# Patient Record
Sex: Male | Born: 2000 | Race: White | Hispanic: No | Marital: Single | Smoking: Never smoker
Health system: Southern US, Community
[De-identification: ages and names within clinical notes are randomized; demographics above are authoritative.]

---
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
## 2018-02-06 IMAGING — CT CT RENAL STONE PROTOCOL
2 of 4 series · 16 of 46 positions shown, 18 images · non-contrast
Comparison: None

CLINICAL DATA: RIGHT CVA/flank pain since early this morning,
microscopic hematuria

EXAM:
CT ABDOMEN AND PELVIS WITHOUT CONTRAST
TECHNIQUE: Multidetector CT imaging of the abdomen and pelvis was performed
following the standard protocol without IV contrast. Sagittal and
coronal MPR images reconstructed from axial data set. Oral contrast
was not administered

[Series 2: renal stone 5mm · axial · 0.63mm/px · z∈[+640,+1075]mm · 13 of 95 slices shown, 15 images]
[im 4/95  soft-tissue]
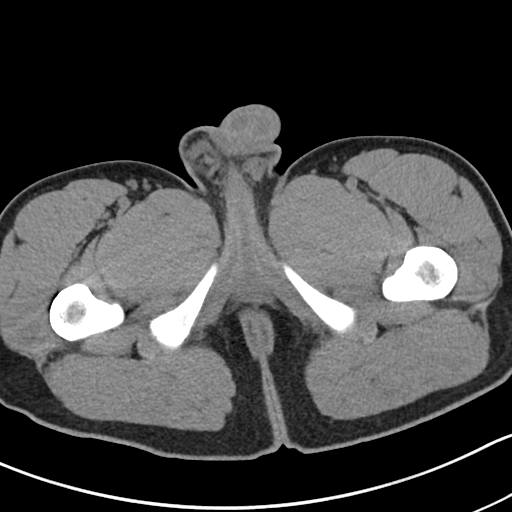
[im 4/95  bone]
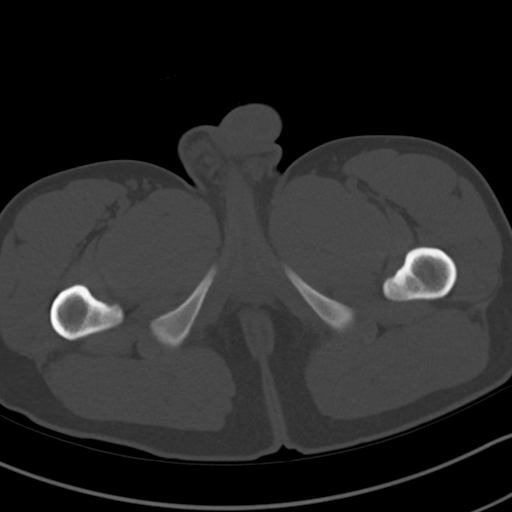
[im 12/95  soft-tissue]
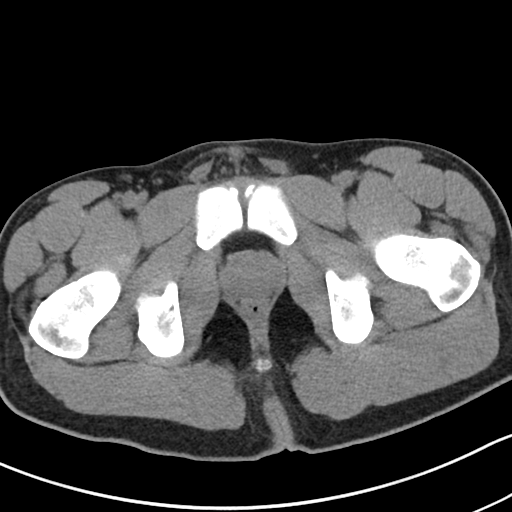
[im 19/95  soft-tissue]
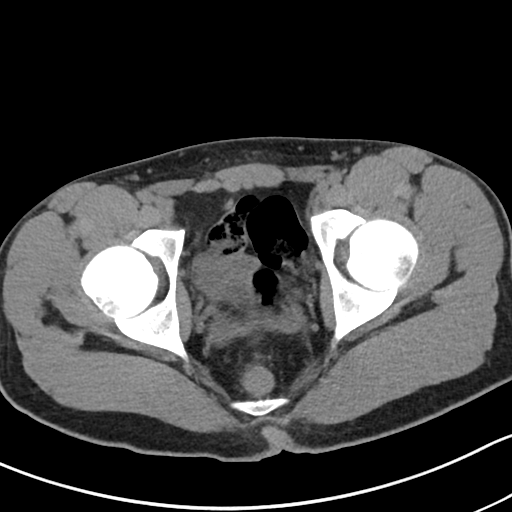
[im 27/95  soft-tissue]
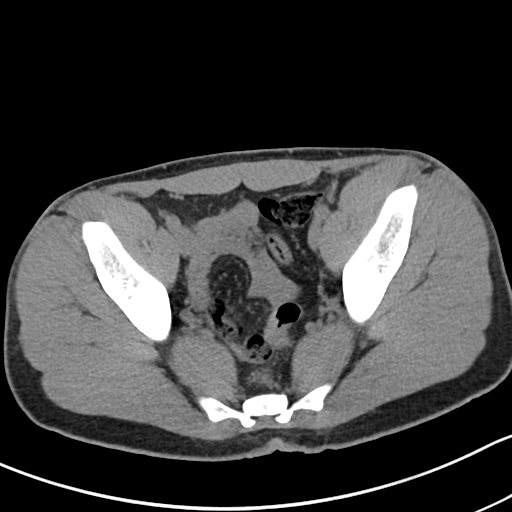
[im 34/95  soft-tissue]
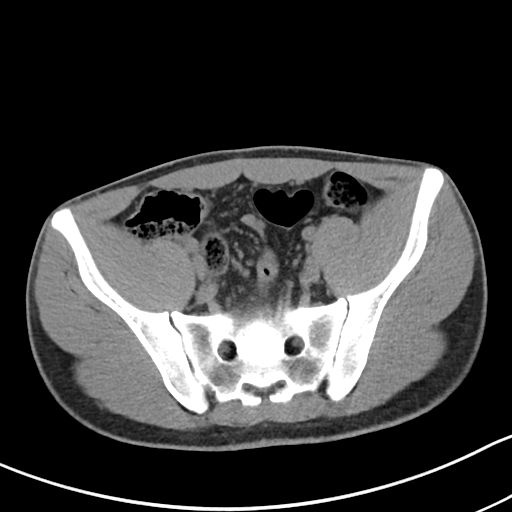
[im 42/95  soft-tissue]
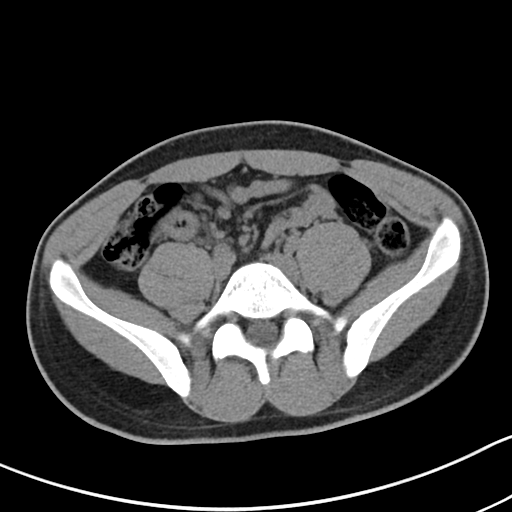
[im 49/95  soft-tissue]
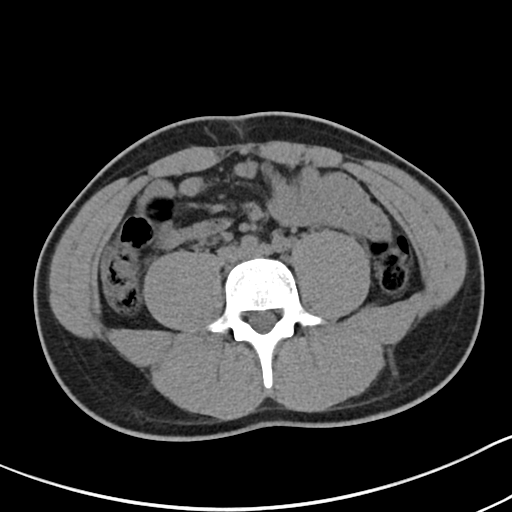
[im 53/95  soft-tissue]
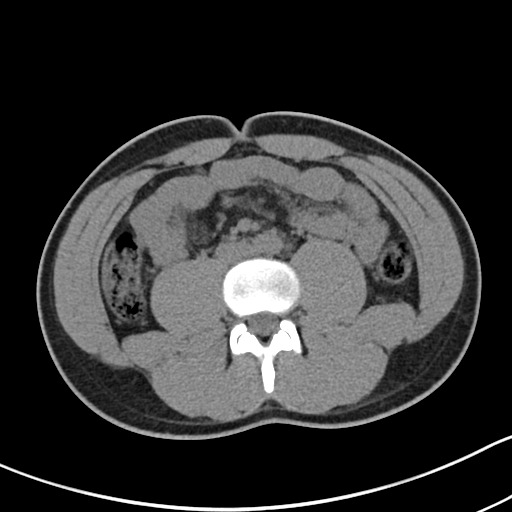
[im 61/95  soft-tissue]
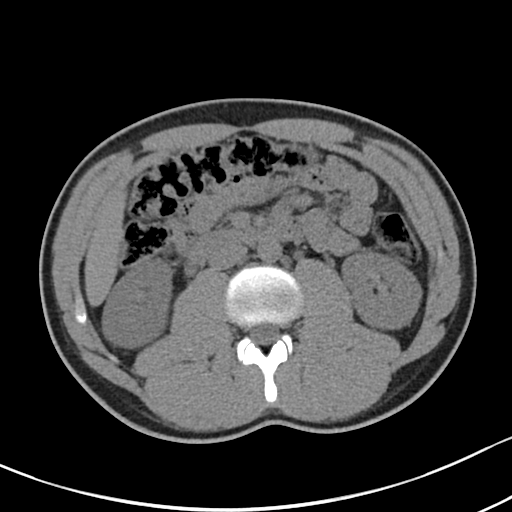
[im 61/95  bone]
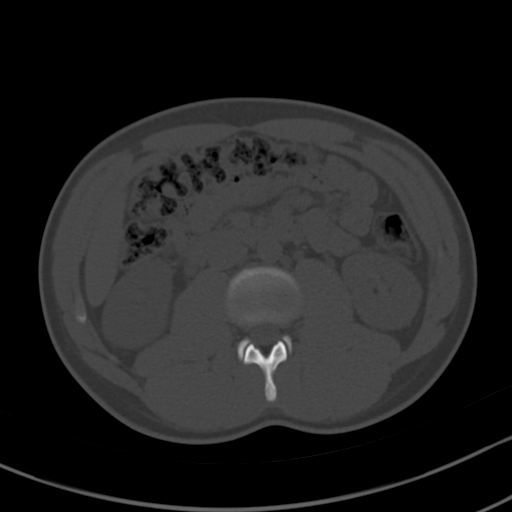
[im 68/95  soft-tissue]
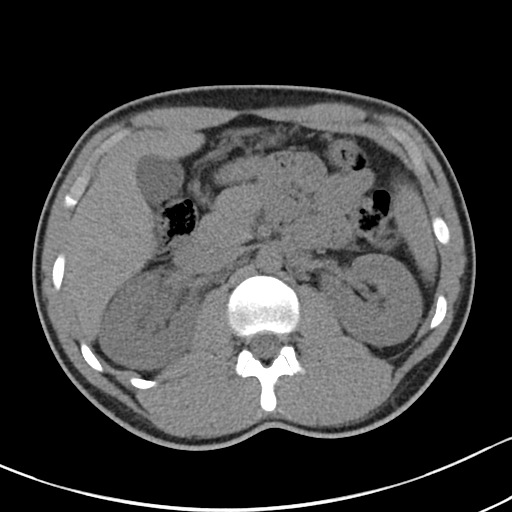
[im 76/95  soft-tissue]
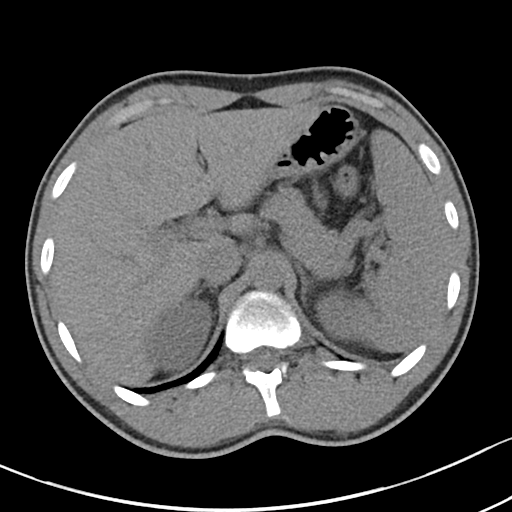
[im 83/95  soft-tissue]
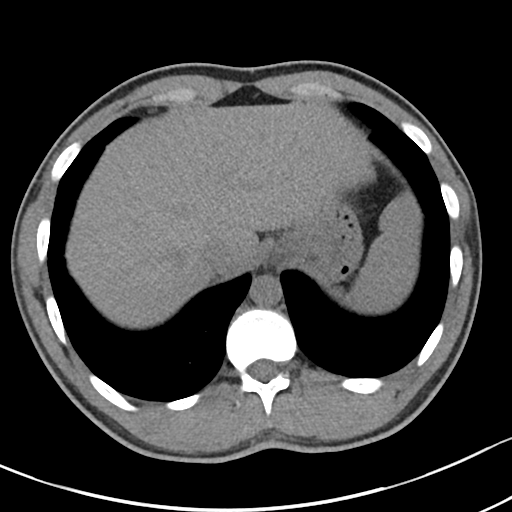
[im 91/95  soft-tissue]
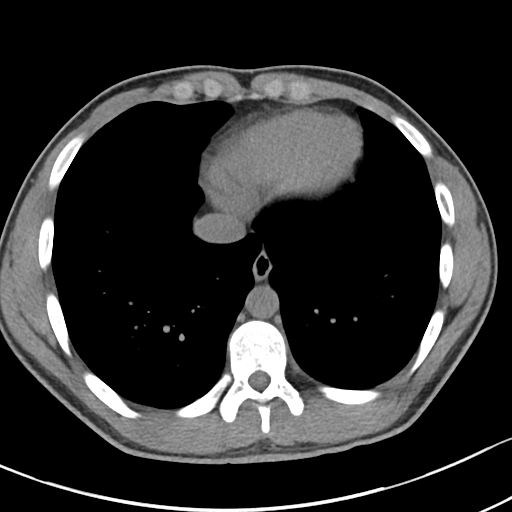

[Series 4: renal stone 3.0 cor · coronal · 0.64mm/px · 3 of 101 slices shown]
[im 34/101  soft-tissue]
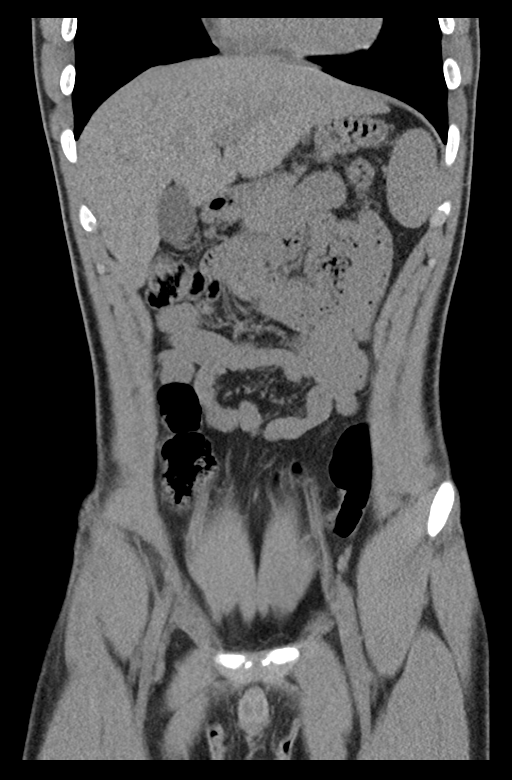
[im 45/101  soft-tissue]
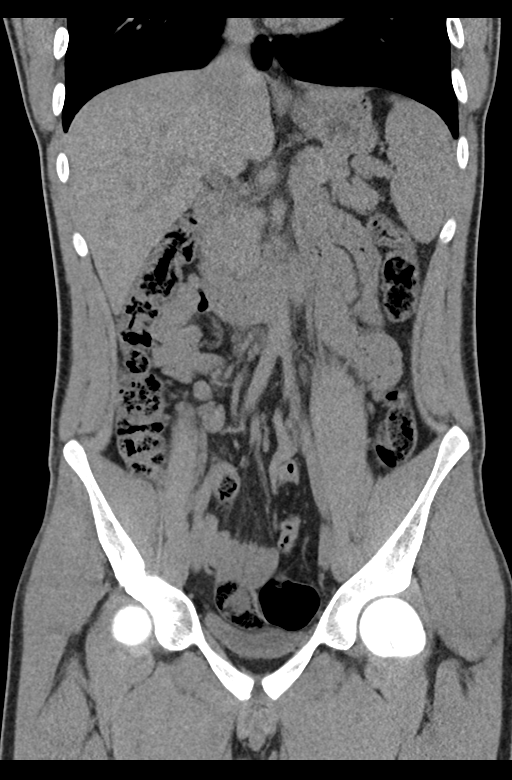
[im 56/101  soft-tissue]
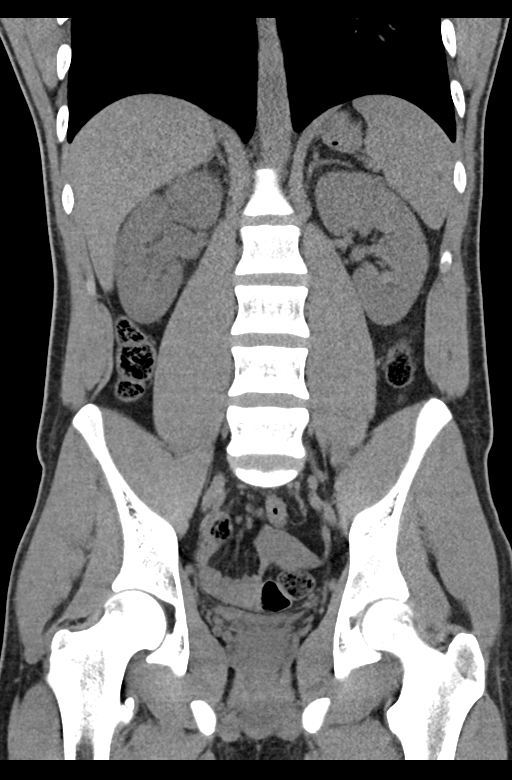

[16 of 46 positions shown; findings below may reference images not displayed]

FINDINGS: Lower chest: Cleared

Hepatobiliary: Normal appearance

Pancreas: Normal appearance

Spleen: Normal appearance

Adrenals/Urinary Tract: RIGHT hydronephrosis due to a 5 mm proximal
RIGHT ureteral calculus. Additional tiny calcification 2 mm diameter
is seen inferior to the 5 mm calculus, question additional tiny
proximal RIGHT ureteral calculus. Kidneys, LEFT ureter, and bladder
otherwise normal appearance.

Stomach/Bowel: Normal appendix. Stomach and bowel loops unremarkable
for technique.

Vascular/Lymphatic: Normal appearance

Reproductive: N/A

Other: No free air or free fluid.  No hernia.

Musculoskeletal: No acute osseous findings. Small RIGHT os
acetabulum.
IMPRESSION: RIGHT hydronephrosis secondary to a 5 mm proximal RIGHT ureteral
calculus with question additional tiny 2 mm proximal LEFT ureteral
calculus as well.

## 2020-03-01 DIAGNOSIS — Z23 Encounter for immunization: Secondary | ICD-10-CM | POA: Diagnosis not present

## 2020-04-04 DIAGNOSIS — Z23 Encounter for immunization: Secondary | ICD-10-CM | POA: Diagnosis not present

## 2020-06-08 DIAGNOSIS — N132 Hydronephrosis with renal and ureteral calculous obstruction: Secondary | ICD-10-CM | POA: Diagnosis not present

## 2020-06-08 DIAGNOSIS — N200 Calculus of kidney: Secondary | ICD-10-CM | POA: Diagnosis not present

## 2020-06-08 DIAGNOSIS — R109 Unspecified abdominal pain: Secondary | ICD-10-CM | POA: Diagnosis not present

## 2020-06-08 DIAGNOSIS — M545 Low back pain: Secondary | ICD-10-CM | POA: Diagnosis not present
